# Patient Record
Sex: Male | Born: 1980 | Race: White | Hispanic: No | Marital: Single
Health system: Southern US, Community
[De-identification: ages and names within clinical notes are randomized; demographics above are authoritative.]

---

## 2008-01-30 ENCOUNTER — Emergency Department (HOSPITAL_COMMUNITY): Admission: EM | Admit: 2008-01-30 | Discharge: 2008-01-30 | Payer: Self-pay | Admitting: Emergency Medicine

## 2008-03-14 ENCOUNTER — Emergency Department (HOSPITAL_COMMUNITY): Admission: EM | Admit: 2008-03-14 | Discharge: 2008-03-14 | Payer: Self-pay | Admitting: Emergency Medicine

## 2008-04-11 ENCOUNTER — Emergency Department (HOSPITAL_COMMUNITY): Admission: EM | Admit: 2008-04-11 | Discharge: 2008-04-11 | Payer: Self-pay | Admitting: Emergency Medicine

## 2008-04-23 ENCOUNTER — Emergency Department (HOSPITAL_COMMUNITY): Admission: EM | Admit: 2008-04-23 | Discharge: 2008-04-23 | Payer: Self-pay | Admitting: Emergency Medicine

## 2008-07-13 ENCOUNTER — Emergency Department (HOSPITAL_COMMUNITY): Admission: EM | Admit: 2008-07-13 | Discharge: 2008-07-13 | Payer: Self-pay | Admitting: Emergency Medicine
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
## 2010-07-23 LAB — URINALYSIS, ROUTINE W REFLEX MICROSCOPIC
Glucose, UA: NEGATIVE mg/dL
Hgb urine dipstick: NEGATIVE
Ketones, ur: 15 mg/dL — AB
Protein, ur: NEGATIVE mg/dL
pH: 6 (ref 5.0–8.0)

## 2017-03-27 DIAGNOSIS — R079 Chest pain, unspecified: Secondary | ICD-10-CM

## 2020-08-22 ENCOUNTER — Emergency Department (HOSPITAL_COMMUNITY)
Admission: EM | Admit: 2020-08-22 | Discharge: 2020-08-22 | Disposition: A | Discharge status: Home / Self Care | Payer: Self-pay | Attending: Emergency Medicine | Admitting: Emergency Medicine

## 2020-08-22 ENCOUNTER — Other Ambulatory Visit: Payer: Self-pay

## 2020-08-22 ENCOUNTER — Emergency Department (HOSPITAL_COMMUNITY): Payer: Self-pay

## 2020-08-22 ENCOUNTER — Encounter (HOSPITAL_COMMUNITY): Payer: Self-pay

## 2020-08-22 DIAGNOSIS — X58XXXA Exposure to other specified factors, initial encounter: Secondary | ICD-10-CM | POA: Insufficient documentation

## 2020-08-22 DIAGNOSIS — S70211A Abrasion, right hip, initial encounter: Secondary | ICD-10-CM | POA: Insufficient documentation

## 2020-08-22 DIAGNOSIS — S80211A Abrasion, right knee, initial encounter: Secondary | ICD-10-CM | POA: Insufficient documentation

## 2020-08-22 DIAGNOSIS — T07XXXA Unspecified multiple injuries, initial encounter: Secondary | ICD-10-CM

## 2020-08-22 DIAGNOSIS — S0083XA Contusion of other part of head, initial encounter: Secondary | ICD-10-CM | POA: Insufficient documentation

## 2020-08-22 NOTE — ED Triage Notes (Signed)
Pt BIB [facility]. Per police jail RN stated he needed to be medically cleared before jail could take him. Pt c/o right leg and right hip pain. Pt ambulatory with [facility].

## 2020-08-22 NOTE — ED Notes (Signed)
An After Visit Summary was printed and given to the patient. Discharge instructions given and no further questions at this time.  Pt leaving in police custody.
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
